# Patient Record
Sex: Female | Born: 1990 | Race: White | Hispanic: No | Marital: Single | Smoking: Never smoker
Health system: Southern US, Community
[De-identification: ages and names within clinical notes are randomized; demographics above are authoritative.]

---
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
## 2008-01-21 ENCOUNTER — Emergency Department (HOSPITAL_COMMUNITY): Admission: EM | Admit: 2008-01-21 | Discharge: 2008-01-21 | Payer: Self-pay | Admitting: Emergency Medicine

## 2008-02-11 ENCOUNTER — Emergency Department (HOSPITAL_COMMUNITY): Admission: EM | Admit: 2008-02-11 | Discharge: 2008-02-11 | Payer: Self-pay | Admitting: Emergency Medicine

## 2008-03-07 ENCOUNTER — Emergency Department (HOSPITAL_COMMUNITY): Admission: EM | Admit: 2008-03-07 | Discharge: 2008-03-08 | Payer: Self-pay | Admitting: Emergency Medicine

## 2008-04-23 ENCOUNTER — Emergency Department (HOSPITAL_COMMUNITY): Admission: EM | Admit: 2008-04-23 | Discharge: 2008-04-23 | Payer: Self-pay | Admitting: Emergency Medicine

## 2008-06-09 ENCOUNTER — Emergency Department (HOSPITAL_COMMUNITY): Admission: EM | Admit: 2008-06-09 | Discharge: 2008-06-09 | Payer: Self-pay | Admitting: Emergency Medicine

## 2010-03-31 ENCOUNTER — Emergency Department (HOSPITAL_COMMUNITY): Admission: EM | Admit: 2010-03-31 | Discharge: 2010-03-31 | Payer: Self-pay | Admitting: Emergency Medicine

## 2010-08-06 LAB — URINALYSIS, ROUTINE W REFLEX MICROSCOPIC
Bilirubin Urine: NEGATIVE
Glucose, UA: NEGATIVE mg/dL
Protein, ur: 30 mg/dL — AB
Urobilinogen, UA: 0.2 mg/dL (ref 0.0–1.0)

## 2010-08-06 LAB — BASIC METABOLIC PANEL
BUN: 12 mg/dL (ref 6–23)
CO2: 26 mEq/L (ref 19–32)
Chloride: 108 mEq/L (ref 96–112)
GFR calc non Af Amer: >60 mL/min (ref >60)
Glucose, Bld: 137 mg/dL — ABNORMAL HIGH (ref 70–99)
Potassium: 4.1 mEq/L (ref 3.5–5.1)

## 2010-08-06 LAB — DIFFERENTIAL
Basophils Relative: 0 % (ref 0–1)
Eosinophils Absolute: 0 10*3/uL (ref 0.0–0.7)
Eosinophils Relative: 0 % (ref 0–5)
Lymphocytes Relative: 7 % — ABNORMAL LOW (ref 12–46)
Monocytes Absolute: 0.8 10*3/uL (ref 0.1–1.0)

## 2010-08-06 LAB — CBC
Hemoglobin: 11.8 g/dL — ABNORMAL LOW (ref 12.0–15.0)
Platelets: 359 10*3/uL (ref 150–400)
WBC: 13.7 10*3/uL — ABNORMAL HIGH (ref 4.0–10.5)

## 2010-08-06 LAB — POCT PREGNANCY, URINE: Preg Test, Ur: NEGATIVE

## 2010-08-06 LAB — URINE MICROSCOPIC-ADD ON

## 2010-09-09 LAB — URINALYSIS, ROUTINE W REFLEX MICROSCOPIC
Bilirubin Urine: NEGATIVE
Protein, ur: NEGATIVE mg/dL
pH: 8.5 — ABNORMAL HIGH (ref 5.0–8.0)

## 2010-09-09 LAB — PREGNANCY, URINE: Preg Test, Ur: NEGATIVE

## 2011-01-11 IMAGING — CT CT ABD-PELV W/ CM
2 of 4 series · 17 of 46 positions shown, 19 images · IV contrast (APPLIED)
Comparison: 01/21/2008

CLINICAL DATA: Left flank pain.  Left lower quadrant pain.
History of kidney stones.  Hematuria.

CT ABDOMEN AND PELVIS WITH CONTRAST
TECHNIQUE: Multidetector CT imaging of the abdomen and pelvis was
performed using the standard protocol following bolus
administration of intravenous contrast.
Contrast: 100 ml 7mnipaque-FEE

[Series 2: abd/pelv with 5.0 b31f st · axial · 0.65mm/px · z∈[-470,-30]mm · 14 of 96 slices shown, 16 images]
[im 4/96  soft-tissue]
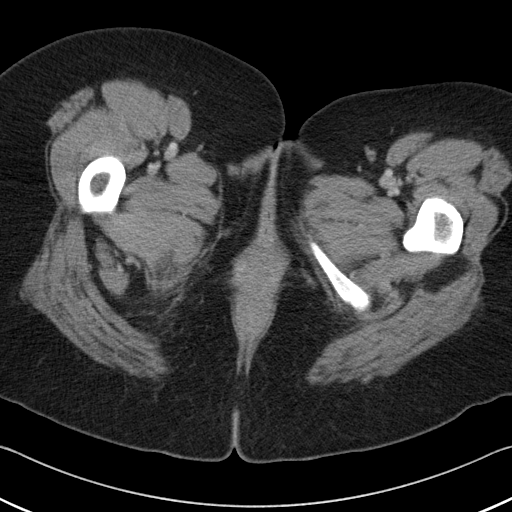
[im 4/96  bone]
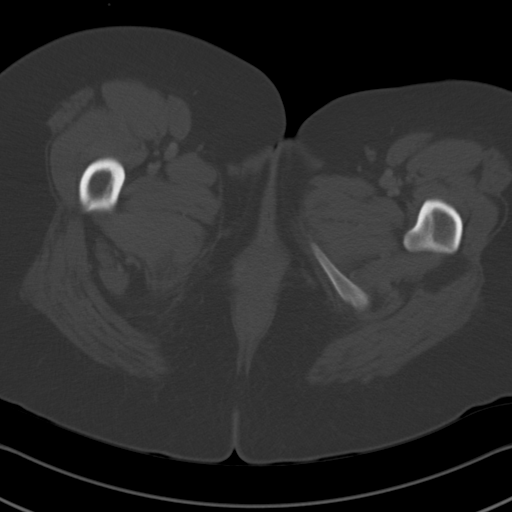
[im 12/96  soft-tissue]
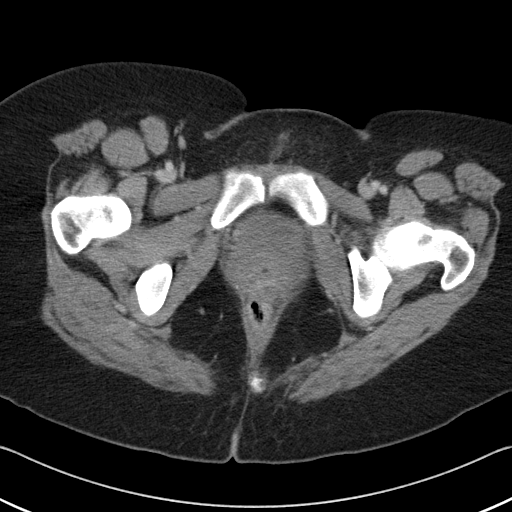
[im 20/96  soft-tissue]
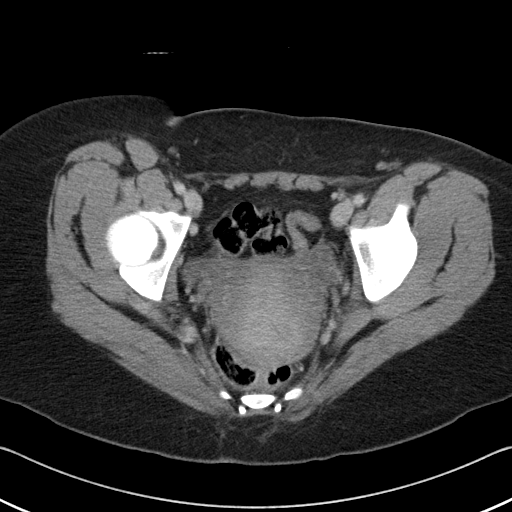
[im 27/96  soft-tissue]
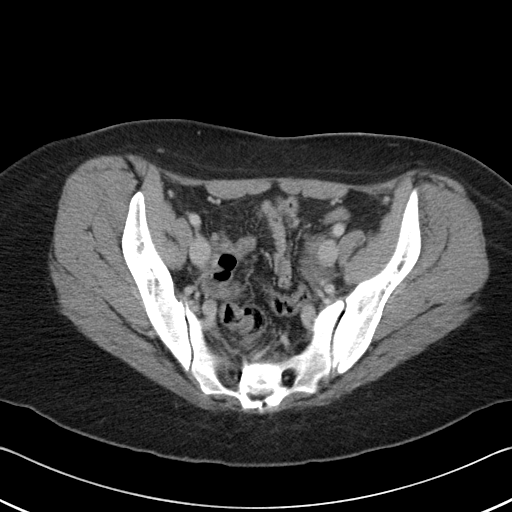
[im 31/96  soft-tissue]
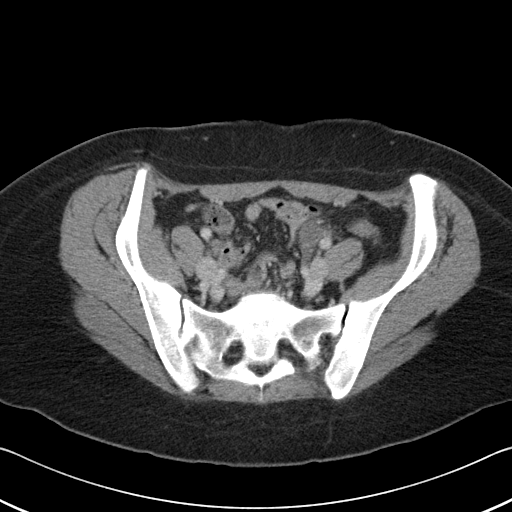
[im 39/96  soft-tissue]
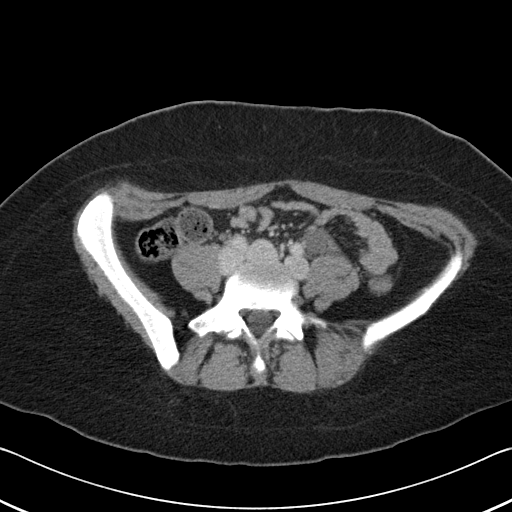
[im 46/96  soft-tissue]
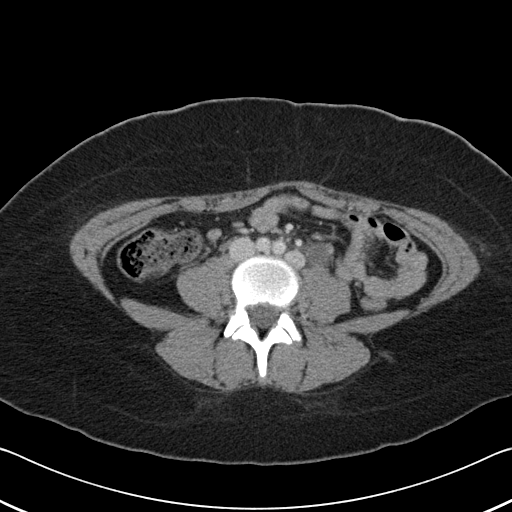
[im 50/96  soft-tissue]
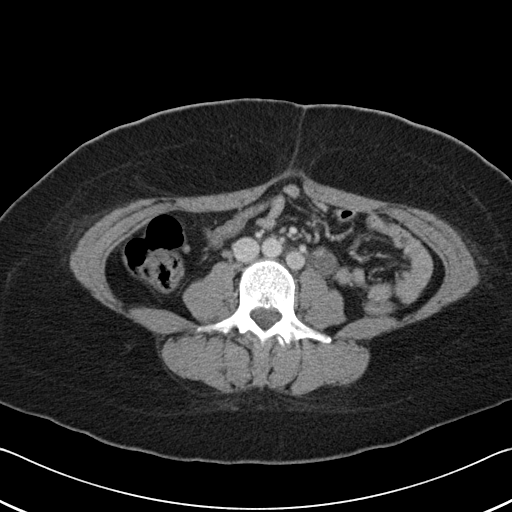
[im 58/96  soft-tissue]
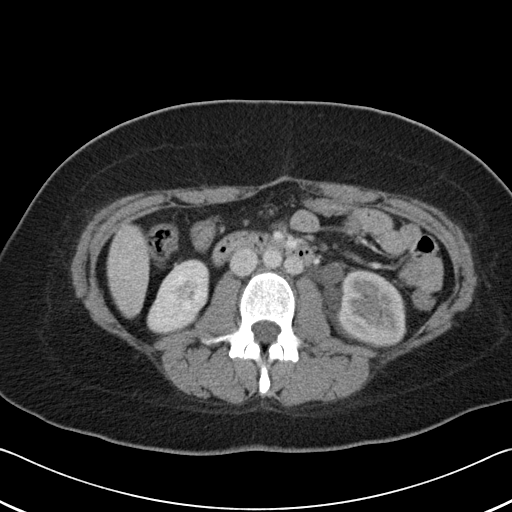
[im 58/96  bone]
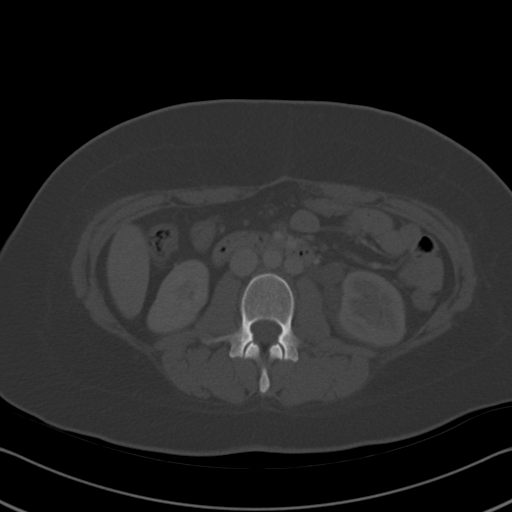
[im 65/96  soft-tissue]
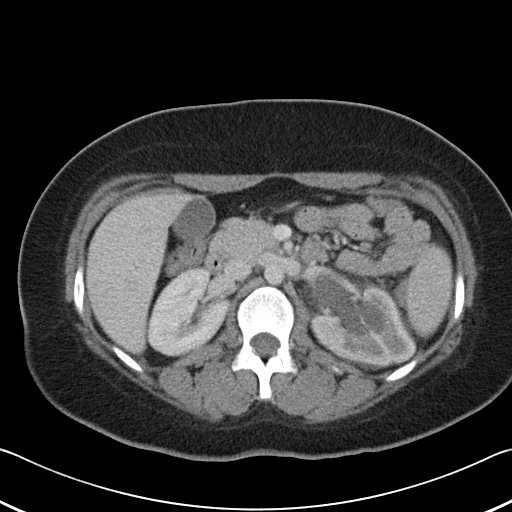
[im 73/96  soft-tissue]
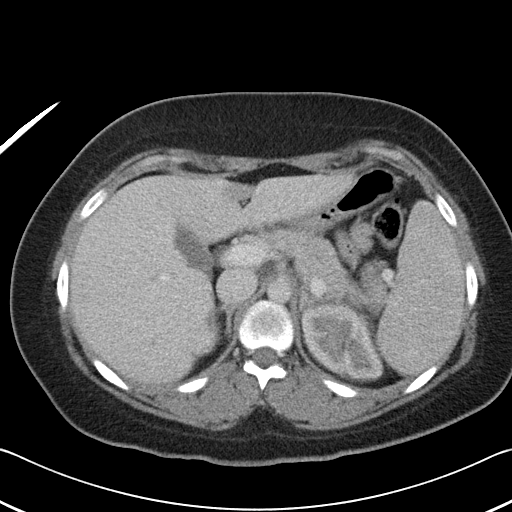
[im 77/96  soft-tissue]
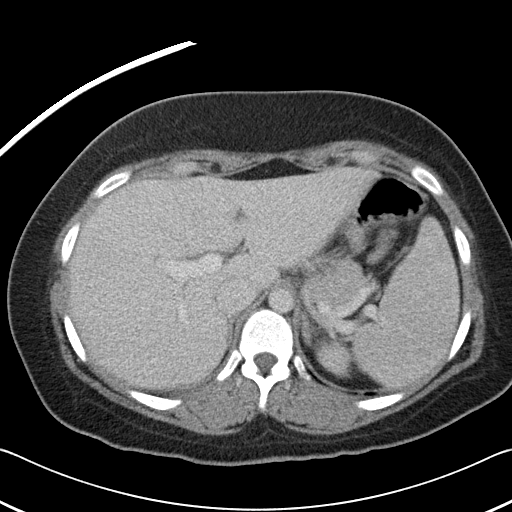
[im 84/96  soft-tissue]
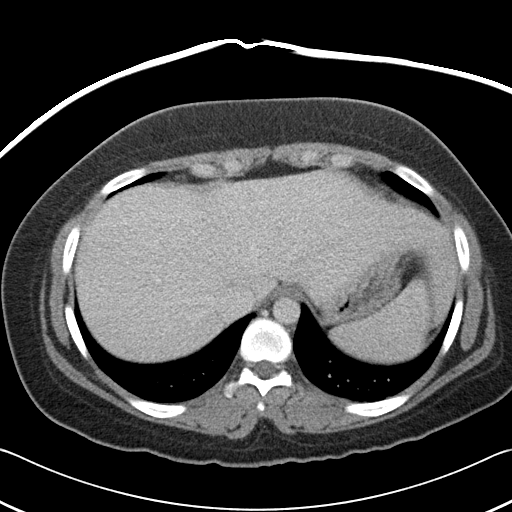
[im 92/96  soft-tissue]
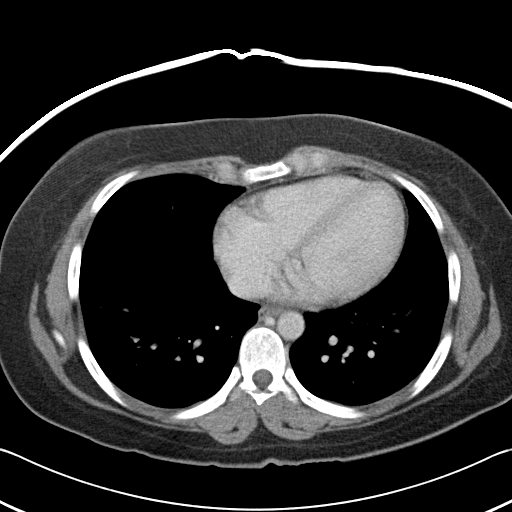

[Series 602: cor · coronal · 0.93mm/px · 3 of 66 slices shown]
[im 22/66  soft-tissue]
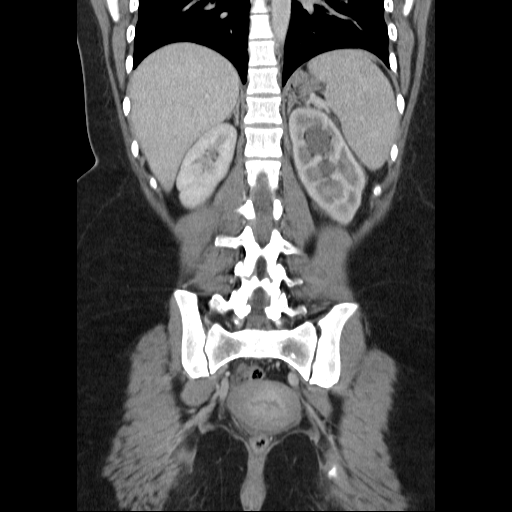
[im 29/66  soft-tissue]
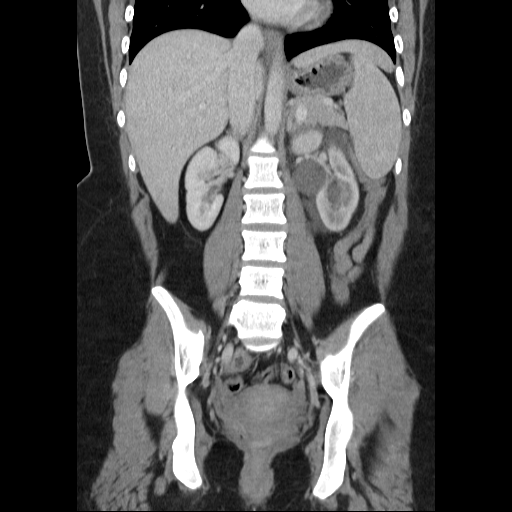
[im 37/66  soft-tissue]
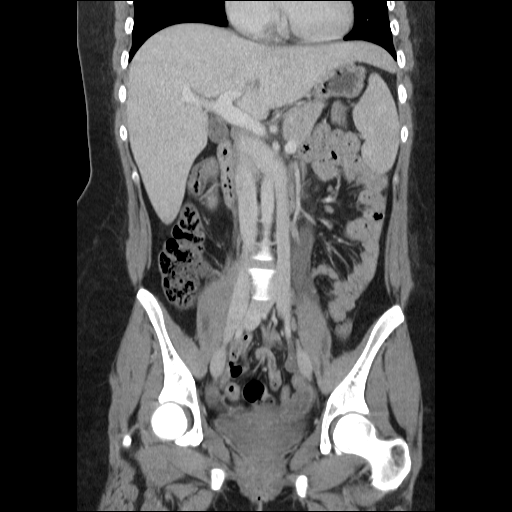

[17 of 46 positions shown; findings below may reference images not displayed]

FINDINGS: Images of the lung bases are unremarkable.  There is
marked left hydronephrosis, hydroureter.  There is perinephric
stranding and peri ureteral stranding on the left.  A distal left
ureteral calculus measures 6 mm in diameter.

No focal abnormality is identified within the liver, spleen,
pancreas, adrenal glands, or right kidney.  The gallbladder is
present.

The appendix is well seen and has a normal appearance.  Visualized
bowel loops are normal in caliber and wall thickness.

No evidence for aortic aneurysm.

The uterus is present.  No adnexal mass or free pelvic fluid
identified. No retroperitoneal or mesenteric adenopathy.
IMPRESSION: High-grade left ureteral obstruction secondary to a left ureteral
vesicle junction calculus measuring 6 mm.

## 2011-02-21 LAB — URINALYSIS, ROUTINE W REFLEX MICROSCOPIC
Bilirubin Urine: NEGATIVE
Glucose, UA: NEGATIVE
Leukocytes, UA: NEGATIVE
Specific Gravity, Urine: >1.03 — ABNORMAL HIGH
Urobilinogen, UA: 0.2

## 2011-02-21 LAB — URINE MICROSCOPIC-ADD ON

## 2011-02-21 LAB — COMPREHENSIVE METABOLIC PANEL
ALT: 15
AST: 15
Albumin: 3.9
Calcium: 9.4
Creatinine, Ser: 0.83
Total Protein: 7.1

## 2011-02-21 LAB — DIFFERENTIAL
Basophils Absolute: 0
Eosinophils Relative: 0
Lymphocytes Relative: 17
Monocytes Absolute: 0.6

## 2011-02-21 LAB — CBC
HCT: 36.1
Hemoglobin: 12
Platelets: 405 — ABNORMAL HIGH
RDW: 16 — ABNORMAL HIGH
WBC: 7.2

## 2011-02-24 LAB — STREP A DNA PROBE: Group A Strep Probe: POSITIVE

## 2011-02-24 LAB — RAPID STREP SCREEN (MED CTR MEBANE ONLY): Streptococcus, Group A Screen (Direct): NEGATIVE

## 2011-03-07 LAB — URINALYSIS, ROUTINE W REFLEX MICROSCOPIC
Bilirubin Urine: NEGATIVE
Hgb urine dipstick: NEGATIVE
Nitrite: NEGATIVE
Specific Gravity, Urine: 1.015
Urobilinogen, UA: 0.2
pH: 6

## 2011-03-07 LAB — URINE MICROSCOPIC-ADD ON

## 2011-03-07 LAB — DIFFERENTIAL
Basophils Absolute: 0.1
Basophils Relative: 1
Lymphocytes Relative: 16 — ABNORMAL LOW
Monocytes Absolute: 0.7
Monocytes Relative: 5
Neutro Abs: 10.7 — ABNORMAL HIGH
Neutrophils Relative %: 77 — ABNORMAL HIGH

## 2011-03-07 LAB — CBC
Hemoglobin: 11.7 — ABNORMAL LOW
RBC: 3.99
WBC: 13.9 — ABNORMAL HIGH

## 2011-03-07 LAB — BASIC METABOLIC PANEL
Calcium: 8.8
Creatinine, Ser: 0.48
Sodium: 135

## 2013-12-10 ENCOUNTER — Encounter (HOSPITAL_COMMUNITY): Payer: Self-pay | Admitting: Emergency Medicine

## 2013-12-10 ENCOUNTER — Emergency Department (HOSPITAL_COMMUNITY)
Admission: EM | Admit: 2013-12-10 | Discharge: 2013-12-11 | Disposition: A | Discharge status: Home / Self Care | Payer: Self-pay | Attending: Emergency Medicine | Admitting: Emergency Medicine

## 2013-12-10 DIAGNOSIS — M545 Low back pain, unspecified: Secondary | ICD-10-CM | POA: Insufficient documentation

## 2013-12-10 DIAGNOSIS — N3001 Acute cystitis with hematuria: Secondary | ICD-10-CM

## 2013-12-10 DIAGNOSIS — Z880 Allergy status to penicillin: Secondary | ICD-10-CM | POA: Insufficient documentation

## 2013-12-10 DIAGNOSIS — N3000 Acute cystitis without hematuria: Secondary | ICD-10-CM | POA: Insufficient documentation

## 2013-12-10 DIAGNOSIS — Z3202 Encounter for pregnancy test, result negative: Secondary | ICD-10-CM | POA: Insufficient documentation

## 2013-12-10 DIAGNOSIS — R112 Nausea with vomiting, unspecified: Secondary | ICD-10-CM | POA: Insufficient documentation

## 2013-12-10 LAB — CBC WITH DIFFERENTIAL/PLATELET
BASOS ABS: 0 10*3/uL (ref 0.0–0.1)
BASOS PCT: 0 % (ref 0–1)
EOS ABS: 0 10*3/uL (ref 0.0–0.7)
EOS PCT: 0 % (ref 0–5)
HEMATOCRIT: 37.7 % (ref 36.0–46.0)
HEMOGLOBIN: 12.2 g/dL (ref 12.0–15.0)
Lymphocytes Relative: 14 % (ref 12–46)
Lymphs Abs: 1.1 10*3/uL (ref 0.7–4.0)
MCH: 26.9 pg (ref 26.0–34.0)
MCHC: 32.4 g/dL (ref 30.0–36.0)
MCV: 83.2 fL (ref 78.0–100.0)
Monocytes Absolute: 0.4 10*3/uL (ref 0.1–1.0)
Monocytes Relative: 5 % (ref 3–12)
NEUTROS PCT: 81 % — AB (ref 43–77)
Neutro Abs: 6.2 10*3/uL (ref 1.7–7.7)
PLATELETS: 418 10*3/uL — AB (ref 150–400)
RBC: 4.53 MIL/uL (ref 3.87–5.11)
RDW: 14.4 % (ref 11.5–15.5)
WBC: 7.7 10*3/uL (ref 4.0–10.5)

## 2013-12-10 LAB — BASIC METABOLIC PANEL
ANION GAP: 18 — AB (ref 5–15)
BUN: 11 mg/dL (ref 6–23)
CHLORIDE: 99 meq/L (ref 96–112)
CO2: 20 meq/L (ref 19–32)
CREATININE: 0.72 mg/dL (ref 0.50–1.10)
Calcium: 9 mg/dL (ref 8.4–10.5)
GFR calc Af Amer: >90 mL/min (ref >90)
GFR calc non Af Amer: >90 mL/min (ref >90)
Glucose, Bld: 103 mg/dL — ABNORMAL HIGH (ref 70–99)
Potassium: 3.8 mEq/L (ref 3.7–5.3)
SODIUM: 137 meq/L (ref 137–147)

## 2013-12-10 LAB — URINALYSIS, ROUTINE W REFLEX MICROSCOPIC
Bilirubin Urine: NEGATIVE
GLUCOSE, UA: NEGATIVE mg/dL
KETONES UR: 15 mg/dL — AB
NITRITE: POSITIVE — AB
PROTEIN: 30 mg/dL — AB
Specific Gravity, Urine: 1.028 (ref 1.005–1.030)
UROBILINOGEN UA: 0.2 mg/dL (ref 0.0–1.0)
pH: 5 (ref 5.0–8.0)

## 2013-12-10 LAB — URINE MICROSCOPIC-ADD ON

## 2013-12-10 LAB — POC URINE PREG, ED: Preg Test, Ur: NEGATIVE

## 2013-12-10 LAB — LIPASE, BLOOD: Lipase: 24 U/L (ref 11–59)

## 2013-12-10 MED ORDER — ONDANSETRON 4 MG PO TBDP
8.0000 mg | ORAL_TABLET | Freq: Once | ORAL | Status: AC
  Administered 2013-12-10: 8 mg via ORAL
  Filled 2013-12-10: qty 2

## 2013-12-10 MED ORDER — ONDANSETRON HCL 4 MG/2ML IJ SOLN
4.0000 mg | Freq: Once | INTRAMUSCULAR | Status: AC
  Administered 2013-12-10: 4 mg via INTRAVENOUS
  Filled 2013-12-10: qty 2

## 2013-12-10 MED ORDER — SODIUM CHLORIDE 0.9 % IV BOLUS (SEPSIS)
2000.0000 mL | Freq: Once | INTRAVENOUS | Status: AC
  Administered 2013-12-10: 1000 mL via INTRAVENOUS

## 2013-12-10 NOTE — ED Notes (Signed)
Woke at 1600 feeling sick. Started with nv. Then developed back and abd pain, also passed out at 1730. "felt fine prior to 1600". Denies pregnancy or chance. Also fever noted when EMS called to scene of syncope.

## 2013-12-11 MED ORDER — HYDROCODONE-ACETAMINOPHEN 5-325 MG PO TABS: 1.0000 | ORAL_TABLET | Freq: Four times a day (QID) | ORAL | Status: DC | PRN

## 2013-12-11 MED ORDER — CIPROFLOXACIN HCL 500 MG PO TABS: 500.0000 mg | ORAL_TABLET | Freq: Two times a day (BID) | ORAL | Status: DC

## 2013-12-11 MED ORDER — CIPROFLOXACIN IN D5W 400 MG/200ML IV SOLN
400.0000 mg | Freq: Once | INTRAVENOUS | Status: AC
  Administered 2013-12-11: 400 mg via INTRAVENOUS
  Filled 2013-12-11: qty 200

## 2013-12-11 NOTE — Discharge Instructions (Signed)
Return here as needed. Follow up with your doctor. Increase your fluid intake. °

## 2013-12-11 NOTE — ED Provider Notes (Signed)
CSN: [id number]     Arrival date & time 12/10/13  2042 History   First MD Initiated Contact with Patient 12/10/13 2301     Chief Complaint  Patient presents with  . Abdominal Pain  . Back Pain  . Emesis  . Loss of Consciousness     (Consider location/radiation/quality/duration/timing/severity/associated sxs/prior Treatment) HPI Patient presents to the emergency department with nausea, vomiting, and lower back pain, that started earlier this evening.  The patient, states, that she's had some mild left lower back pain since earlier today, started having vomiting.  The patient states, that she had an episode, where she passed out for several seconds.  Patient, states, that she's not had any chest pain, shortness of breath, hematuria, vaginal discharge, vaginal bleeding, weakness, dizziness, headache, blurred vision, diarrhea, hematemesis, bloody stool, rash or syncope.  The patient, states nothing seems to make her condition, better or worse.  Patient, states, that she did not take any medications prior to arrival. History reviewed. No pertinent past medical history. History reviewed. No pertinent past surgical history. No family history on file. History  Substance Use Topics  . Smoking status: Never Smoker   . Smokeless tobacco: Not on file  . Alcohol Use: No   OB History   Grav Para Term Preterm Abortions TAB SAB Ect Mult Living                 Review of Systems  All other systems negative except as documented in the HPI. All pertinent positives and negatives as reviewed in the HPI.   Allergies  Penicillins  Home Medications   Prior to Admission medications   Medication Sig Start Date End Date Taking? Authorizing Provider  UNKNOWN TO PATIENT Take 1 tablet by mouth daily.   Yes Historical Provider, MD   BP 117/73  Pulse 81  Temp(Src) 98.3 F (36.8 C) (Oral)  Resp 17  Ht 5' (1.524 m)  Wt 165 lb (74.844 kg)  BMI 32.22 kg/m2  SpO2 100%  LMP 11/12/2013 Physical Exam    Nursing note and vitals reviewed. Constitutional: She is oriented to person, place, and time. She appears well-developed and well-nourished. No distress.  HENT:  Head: Normocephalic and atraumatic.  Mouth/Throat: Oropharynx is clear and moist.  Eyes: Pupils are equal, round, and reactive to light.  Neck: Normal range of motion. Neck supple.  Cardiovascular: Normal rate, regular rhythm and normal heart sounds.  Exam reveals no gallop and no friction rub.   No murmur heard. Pulmonary/Chest: Effort normal and breath sounds normal. No respiratory distress.  Abdominal: Normal appearance.    Neurological: She is alert and oriented to person, place, and time.  Skin: Skin is warm and dry. No erythema.    ED Course  Procedures (including critical care time) Labs Review Labs Reviewed  BASIC METABOLIC PANEL - Abnormal; Notable for the following:    Glucose, Bld 103 (*)    Anion gap 18 (*)    All other components within normal limits  CBC WITH DIFFERENTIAL - Abnormal; Notable for the following:    Platelets 418 (*)    Neutrophils Relative % 81 (*)    All other components within normal limits  URINALYSIS, ROUTINE W REFLEX MICROSCOPIC - Abnormal; Notable for the following:    APPearance CLOUDY (*)    Hgb urine dipstick MODERATE (*)    Ketones, ur 15 (*)    Protein, ur 30 (*)    Nitrite POSITIVE (*)    Leukocytes, UA SMALL (*)  All other components within normal limits  URINE MICROSCOPIC-ADD ON - Abnormal; Notable for the following:    Squamous Epithelial / LPF MANY (*)    Bacteria, UA MANY (*)    All other components within normal limits  LIPASE, BLOOD  POC URINE PREG, ED    Patient may possibly have a kidney stone, but her symptoms seem consistent with UTI and she does have signs of infection in her urine.  Patient's pain is isolated to her lower back and over the suprapubic region with no radiation or wrapping pain from the flank.  Patient has not required any pain medication  here in the emergency department.  Patient has been sleeping.  The last 2 times I went to evaluate her.   Carlyle Dollyhristopher W Jahbari Repinski, PA-C 12/12/13 [phone]

## 2013-12-12 NOTE — ED Provider Notes (Signed)
Medical screening examination/treatment/procedure(s) were performed by non-physician practitioner and as supervising physician I was immediately available for consultation/collaboration.   EKG Interpretation None       Hurman HornJohn M Yuritzi Kamp, MD 12/12/13 2112

## 2015-02-26 ENCOUNTER — Ambulatory Visit: Payer: Self-pay | Admitting: Family Medicine

## 2015-03-05 ENCOUNTER — Ambulatory Visit: Payer: Self-pay | Admitting: Family Medicine

## 2015-03-05 ENCOUNTER — Ambulatory Visit (INDEPENDENT_AMBULATORY_CARE_PROVIDER_SITE_OTHER): Payer: Worker's Compensation | Admitting: Family Medicine

## 2015-03-05 ENCOUNTER — Encounter: Payer: Self-pay | Admitting: Family Medicine

## 2015-03-05 VITALS — BP 105/70 | HR 92 | Temp 97.0°F | Ht 61.52 in | Wt 183.2 lb

## 2015-03-05 DIAGNOSIS — S61412A Laceration without foreign body of left hand, initial encounter: Secondary | ICD-10-CM

## 2015-03-05 NOTE — Progress Notes (Signed)
   HPI  Patient presents today here for Worker's Compensation follow-up.  On September 25 she was at work whenever she got a clean cut between her first and second finger on her first finger with. This is around 3 AM they placed a Steri-Strip and she went to urgent care about 8 hours later when they opened.  She was treated with Keflex She feels she is healed well She has been back to work without any problems.   PMH: Smoking status noted ROS: Per HPI  Objective: BP 105/70 mmHg  Pulse 92  Temp(Src) 97 F (36.1 C) (Oral)  Ht 5' 1.52" (1.563 m)  Wt 183 lb 3.2 oz (83.099 kg)  BMI 34.02 kg/m2 Gen: NAD, alert, cooperative with exam HEENT: NCAT CV: RRR, good S1/S2, no murmur Resp: CTABL, no wheezes, non-labored Ext: No edema, warm Neuro: Alert and oriented, strength 5/5 and sensation intact in left hand Skin: Approximately 1 cm long well-healed laceration and first finger with  Assessment and plan:  # Left hand laceration Completely healed Cleared for return to work Follow-up as needed Discussed health care maintenance encouraged her to establish PCP   Murtis Sink, MD [facility] 03/05/2015, 9:40 AM
